# Patient Record
Sex: Male | Born: 1990 | Race: Black or African American | Hispanic: No | Marital: Single | Smoking: Never smoker
Health system: Southern US, Community
[De-identification: ages and names within clinical notes are randomized; demographics above are authoritative.]

---

## 2014-07-14 ENCOUNTER — Emergency Department (HOSPITAL_COMMUNITY): Payer: BLUE CROSS/BLUE SHIELD

## 2014-07-14 ENCOUNTER — Encounter (HOSPITAL_COMMUNITY): Payer: Self-pay | Admitting: Emergency Medicine

## 2014-07-14 ENCOUNTER — Emergency Department (HOSPITAL_COMMUNITY)
Admission: EM | Admit: 2014-07-14 | Discharge: 2014-07-14 | Disposition: A | Discharge status: Home / Self Care | Payer: BLUE CROSS/BLUE SHIELD | Attending: Emergency Medicine | Admitting: Emergency Medicine

## 2014-07-14 DIAGNOSIS — Y998 Other external cause status: Secondary | ICD-10-CM | POA: Diagnosis not present

## 2014-07-14 DIAGNOSIS — Y929 Unspecified place or not applicable: Secondary | ICD-10-CM | POA: Insufficient documentation

## 2014-07-14 DIAGNOSIS — S93402A Sprain of unspecified ligament of left ankle, initial encounter: Secondary | ICD-10-CM | POA: Insufficient documentation

## 2014-07-14 DIAGNOSIS — X58XXXA Exposure to other specified factors, initial encounter: Secondary | ICD-10-CM | POA: Diagnosis not present

## 2014-07-14 DIAGNOSIS — Y9389 Activity, other specified: Secondary | ICD-10-CM | POA: Diagnosis not present

## 2014-07-14 DIAGNOSIS — S99912A Unspecified injury of left ankle, initial encounter: Secondary | ICD-10-CM | POA: Diagnosis present

## 2014-07-14 MED ORDER — IBUPROFEN 800 MG PO TABS: 800.0000 mg | ORAL_TABLET | Freq: Three times a day (TID) | ORAL | Status: AC

## 2014-07-14 NOTE — Discharge Instructions (Signed)
Be sure to read and understand instructions below prior to leaving the hospital. If your symptoms persist without any improvement in 1 week it is recommended that you follow up with orthopedics listed above. .  Ankle Sprain  An ankle sprain is an injury to the ligaments that hold the ankle joint together. Your X-ray today showed no evidence of fracture, however keep all follow-up appointments with an orthopedic specialist to have follow-up X-rays, because as we discussed fractures may not appear until 3 days after the acute injury.    TREATMENT  Rest, ice, elevation, and compression are the basic modes of treatment.    HOME CARE INSTRUCTIONS  Apply ice to the sore area for 15 to 20 minutes, 3 to 4 times per day. Do this while you are awake for the first 2 days, or as directed. This can be stopped when the swelling goes away. Put the ice in a plastic bag and place a towel between the bag of ice and your skin.  Keep your leg elevated when possible to lessen swelling.  If your caregiver recommends crutches, use them as instructed for 1 week. Then, you may walk on your ankle weight bearing as tolerated.  You may take off your ankle stabilizer at night and to take a shower or bath. Wiggle your toes in the splint several times per day if you are able.  Do not drive a vehicle on pain medication. ACTIVITY:            - Weight bearing as tolerated            - Exercises should be limited to pain free range of motion            - Can start mobilization by tracing the alphabet with your foot in the air.       SEEK MEDICAL CARE IF:  You have an increase in bruising, swelling, or pain.  Your toes feel cold.  Pain relief is not achieved with medications.  EMERGENCY:: Your toes are numb or blue or you have severe pain.  MAKE SURE YOU:  Understand these instructions.  Will watch your condition.  Will get help right away if you are not doing well or get worse   COLD THERAPY DIRECTIONS:  Ice or gel  packs can be used to reduce both pain and swelling. Ice is the most helpful within the first 24 to 48 hours after an injury or flareup from overusing a muscle or joint.  Ice is effective, has very few side effects, and is safe for most people to use.   If you expose your skin to cold temperatures for too long or without the proper protection, you can damage your skin or nerves. Watch for signs of skin damage due to cold.   HOME CARE INSTRUCTIONS  Follow these tips to use ice and cold packs safely.  Place a dry or damp towel between the ice and skin. A damp towel will cool the skin more quickly, so you may need to shorten the time that the ice is used.  For a more rapid response, add gentle compression to the ice.  Ice for no more than 10 to 20 minutes at a time. The bonier the area you are icing, the less time it will take to get the benefits of ice.  Check your skin after 5 minutes to make sure there are no signs of a poor response to cold or skin damage.  Rest 20 minutes or  more in between uses.  Once your skin is numb, you can end your treatment. You can test numbness by very lightly touching your skin. The touch should be so light that you do not see the skin dimple from the pressure of your fingertip. When using ice, most people will feel these normal sensations in this order: cold, burning, aching, and numbness.  Do not use ice on someone who cannot communicate their responses to pain, such as small children or people with dementia.   HOW TO MAKE AN ICE PACK  To make an ice pack, do one of the following:  Place crushed ice or a bag of frozen vegetables in a sealable plastic bag. Squeeze out the excess air. Place this bag inside another plastic bag. Slide the bag into a pillowcase or place a damp towel between your skin and the bag.  Mix 3 parts water with 1 part rubbing alcohol. Freeze the mixture in a sealable plastic bag. When you remove the mixture from the freezer, it will be slushy.  Squeeze out the excess air. Place this bag inside another plastic bag. Slide the bag into a pillowcase or place a damp towel between your sk

## 2014-07-14 NOTE — ED Notes (Signed)
Ice pak applied 

## 2014-07-14 NOTE — ED Provider Notes (Signed)
CSN: [id number]     Arrival date & time 07/14/14  1600 History  This chart was scribed for Santiago Glad, PA-C, working with Elwin Mocha, MD by Chestine Spore, ED Scribe. The patient was seen in room TR07C/TR07C at 4:54 PM.    Chief Complaint  Patient presents with  . Ankle Pain      The history is provided by the patient. No language interpreter was used.    HPI Comments: Bobby Blankenship is a 24 y.o. male with no significant medical hx who presents to the Emergency Department complaining of left ankle pain onset 1 hour ago PTA. Pt twisted his ankle while moving furniture down a stairwell. Pt stepped down on his toes and his ankle rolled. Pt did not fall. Pt was able to walk initially after applying ice.  However, pt is having difficulty bearing weight at this time. He states that he is having associated symptoms of joint swelling. He states that he has tried four 200 mg Advil with moderate relief for his symptoms. He denies numbness, tingling, and any other symptoms.   History reviewed. No pertinent past medical history. History reviewed. No pertinent past surgical history. No family history on file. History  Substance Use Topics  . Smoking status: Never Smoker   . Smokeless tobacco: Not on file  . Alcohol Use: No    Review of Systems  Musculoskeletal: Positive for myalgias, joint swelling and arthralgias.  Neurological: Negative for numbness.       No tingling      Allergies  Review of patient's allergies indicates no known allergies.  Home Medications   Prior to Admission medications   Not on File   BP 140/87 mmHg  Pulse 87  Temp(Src) 98 F (36.7 C) (Oral)  Resp 16  Ht  (1.753 m)  Wt 250 lb (113.399 kg)  BMI 36.90 kg/m2  SpO2 95%  Physical Exam  Constitutional: He is oriented to person, place, and time. He appears well-developed and well-nourished. No distress.  HENT:  Head: Normocephalic and atraumatic.  Eyes: EOM are normal.  Neck: Neck supple. No  tracheal deviation present.  Cardiovascular: Normal rate, regular rhythm and normal heart sounds.   Pulses:      Dorsalis pedis pulses are 2+ on the left side.  Distal sensation intact.   Pulmonary/Chest: Effort normal and breath sounds normal. No respiratory distress.  Musculoskeletal: Normal range of motion.       Left knee: He exhibits normal range of motion and no swelling. No tenderness found.       Left ankle: He exhibits swelling. Tenderness. Lateral malleolus tenderness found. No medial malleolus tenderness found.  Tenderness and swelling on the left lateral malleolus. No tenderness or swelling or the medial malleolus.  Pt can wiggle his toes. ROM of left ankle is limited secondary to pain.   Neurological: He is alert and oriented to person, place, and time.  Skin: Skin is warm and dry.  Psychiatric: He has a normal mood and affect. His behavior is normal.  Nursing note and vitals reviewed.   ED Course  Procedures (including critical care time) DIAGNOSTIC STUDIES: Oxygen Saturation is 95% on RA, normal by my interpretation.    COORDINATION OF CARE: 4:57 PM-Discussed treatment plan which includes left ankle x-ray with pt at bedside and pt agreed to plan.   Labs Review Labs Reviewed - No data to display  Imaging Review Dg Ankle Complete Left  07/14/2014   CLINICAL DATA:  Twisted left ankle  down stairs today, lateral malleolus pain  EXAM: LEFT ANKLE COMPLETE - 3+ VIEW  COMPARISON:  None.  FINDINGS: Three views of left ankle submitted. No acute fracture or subluxation. Ankle mortise is preserved. There is soft tissue swelling adjacent to lateral malleolus.  IMPRESSION: No acute fracture or subluxation.  Lateral soft tissue swelling.   Electronically Signed   By: Natasha MeadLiviu  Pop M.D.   On: 07/14/2014 16:56     EKG Interpretation None      MDM   Final diagnoses:  None   Patient presents today after an ankle injury.  Xray is negative for fracture.  Neurovascularly intact.   Patient given ASO and crutches.  Stable for discharge.  Return precautions given.    I personally performed the services described in this documentation, which was scribed in my presence. The recorded information has been reviewed and is accurate.   Santiago GladHeather Zacharia Sowles, PA-C 07/14/14 1718  Santiago GladHeather Imani Sherrin, PA-C 07/14/14 [id number]  Elwin MochaBlair Walden, MD 07/14/14 [phone]

## 2014-07-14 NOTE — ED Notes (Signed)
Pt. Demonstrated proper technique of using crutches.

## 2014-07-14 NOTE — ED Notes (Signed)
Pt reports 1 hour ago he twisted L ankle. Swelling noted and pt having difficulty baring weight.

## 2015-09-08 IMAGING — CR DG ANKLE COMPLETE 3+V*L*
3 series · 3 of 3 positions shown · non-contrast
Comparison: None.

CLINICAL DATA: Twisted left ankle down stairs today, lateral
malleolus pain

EXAM:
LEFT ANKLE COMPLETE - 3+ VIEW

[x ankle ap left]
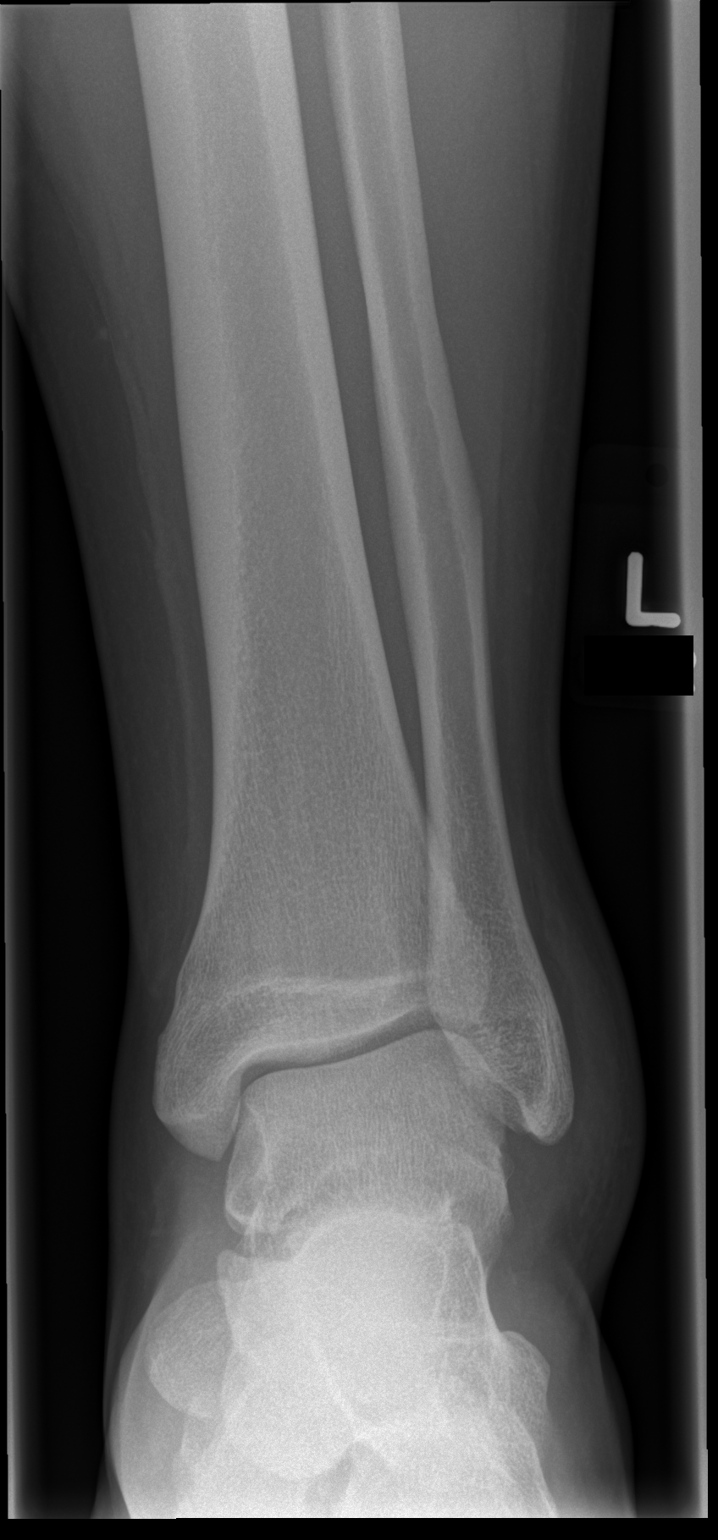

[x ankle obl left]
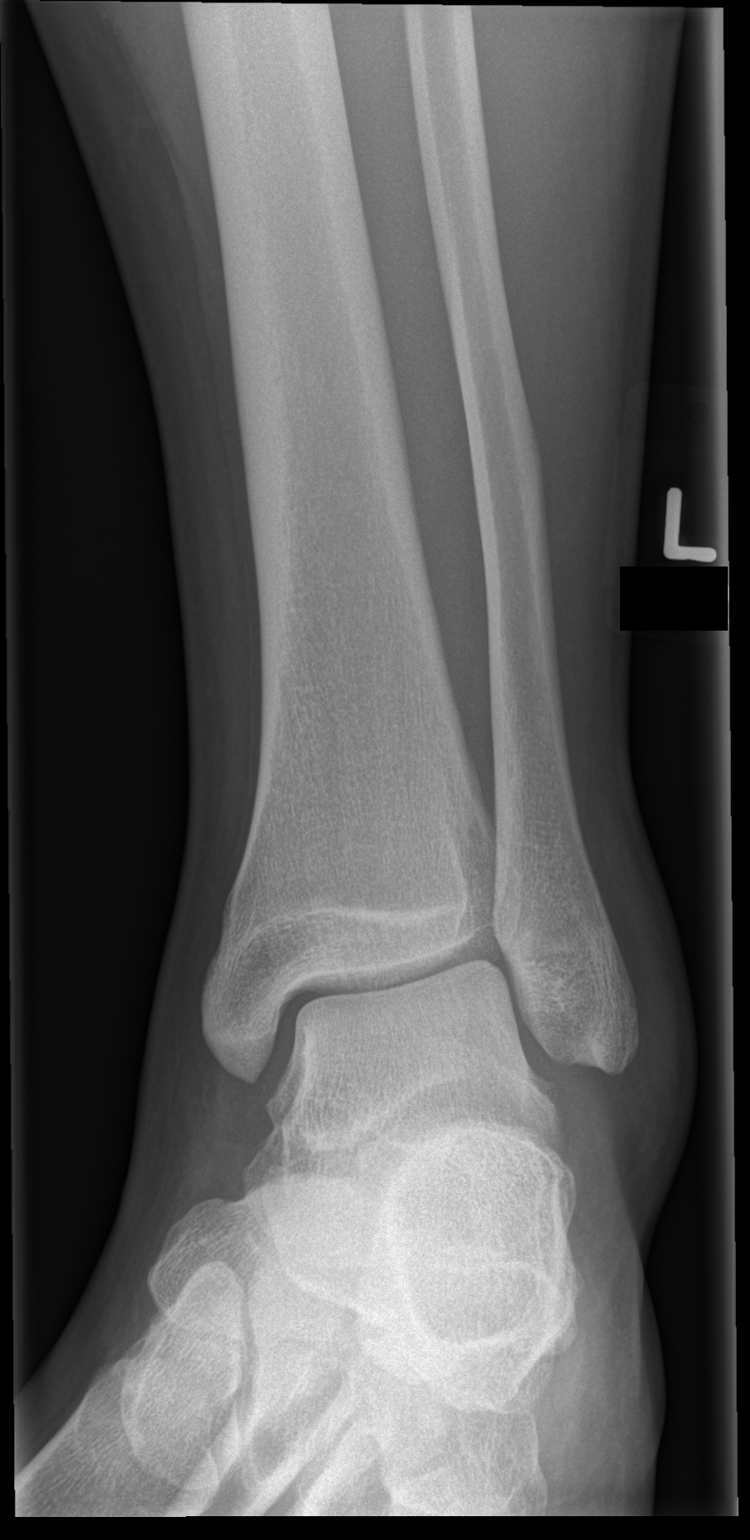

[x ankle lat left]
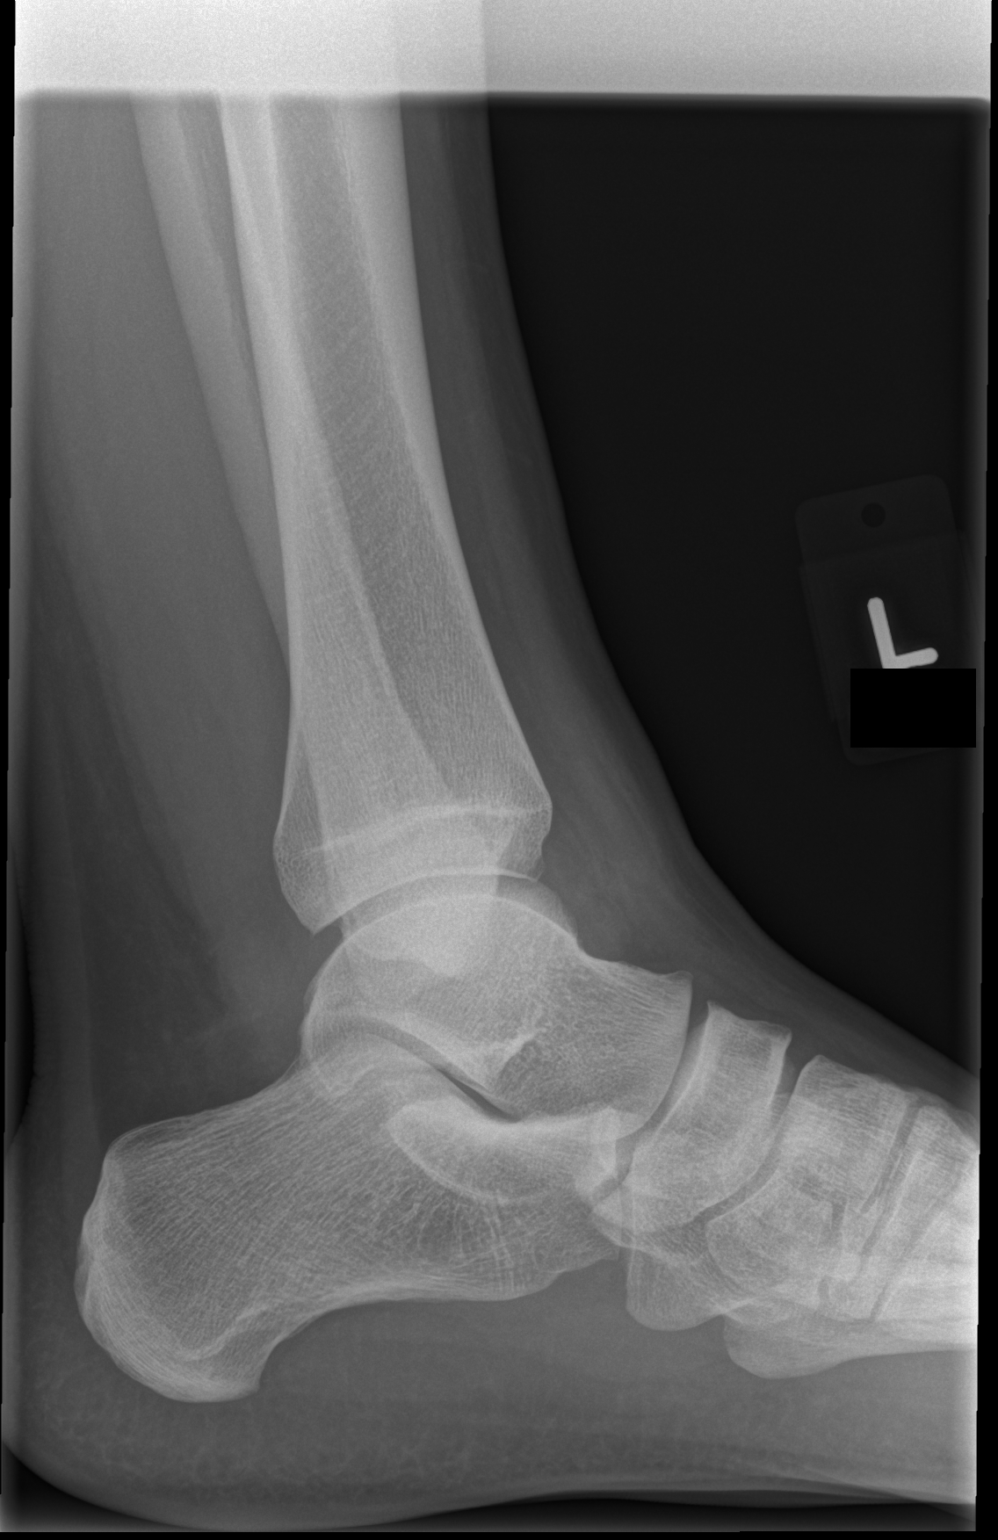

[3 of 3 positions shown; findings below may reference images not displayed]

FINDINGS: Three views of left ankle submitted. No acute fracture or
subluxation. Ankle mortise is preserved. There is soft tissue
swelling adjacent to lateral malleolus.
IMPRESSION: No acute fracture or subluxation.  Lateral soft tissue swelling.

## 2015-12-20 ENCOUNTER — Emergency Department (HOSPITAL_COMMUNITY)
Admission: EM | Admit: 2015-12-20 | Discharge: 2015-12-20 | Disposition: A | Discharge status: Home / Self Care | Payer: BLUE CROSS/BLUE SHIELD | Attending: Emergency Medicine | Admitting: Emergency Medicine

## 2015-12-20 ENCOUNTER — Encounter (HOSPITAL_COMMUNITY): Payer: Self-pay | Admitting: Emergency Medicine

## 2015-12-20 DIAGNOSIS — R109 Unspecified abdominal pain: Secondary | ICD-10-CM

## 2015-12-20 LAB — COMPREHENSIVE METABOLIC PANEL
ALT: 18 U/L (ref 17–63)
AST: 25 U/L (ref 15–41)
Albumin: 4.1 g/dL (ref 3.5–5.0)
Alkaline Phosphatase: 51 U/L (ref 38–126)
Anion gap: 10 (ref 5–15)
BILIRUBIN TOTAL: 0.8 mg/dL (ref 0.3–1.2)
BUN: 12 mg/dL (ref 6–20)
CO2: 24 mmol/L (ref 22–32)
Calcium: 9.6 mg/dL (ref 8.9–10.3)
Chloride: 104 mmol/L (ref 101–111)
Creatinine, Ser: 1.26 mg/dL — ABNORMAL HIGH (ref 0.61–1.24)
GFR calc Af Amer: >60 mL/min (ref >60)
Glucose, Bld: 162 mg/dL — ABNORMAL HIGH (ref 65–99)
Potassium: 3.6 mmol/L (ref 3.5–5.1)
Sodium: 138 mmol/L (ref 135–145)
TOTAL PROTEIN: 7.3 g/dL (ref 6.5–8.1)

## 2015-12-20 LAB — CBC
HCT: 44.2 % (ref 39.0–52.0)
Hemoglobin: 14.3 g/dL (ref 13.0–17.0)
MCH: 27.7 pg (ref 26.0–34.0)
MCHC: 32.4 g/dL (ref 30.0–36.0)
MCV: 85.7 fL (ref 78.0–100.0)
PLATELETS: 251 10*3/uL (ref 150–400)
RBC: 5.16 MIL/uL (ref 4.22–5.81)
RDW: 13.7 % (ref 11.5–15.5)
WBC: 6.6 10*3/uL (ref 4.0–10.5)

## 2015-12-20 LAB — URINALYSIS, ROUTINE W REFLEX MICROSCOPIC
Bilirubin Urine: NEGATIVE
Glucose, UA: 500 mg/dL — AB
Hgb urine dipstick: NEGATIVE
Ketones, ur: NEGATIVE mg/dL
LEUKOCYTES UA: NEGATIVE
Nitrite: NEGATIVE
PROTEIN: NEGATIVE mg/dL
Specific Gravity, Urine: 1.026 (ref 1.005–1.030)
pH: 6 (ref 5.0–8.0)

## 2015-12-20 LAB — LIPASE, BLOOD: Lipase: 21 U/L (ref 11–51)

## 2015-12-20 NOTE — ED Triage Notes (Signed)
Pt c/o pain and discomfort in abdomen around belly button. Pain since Wednesday. Tender. Nausea earlier but no vomiting. Denies diarrhea.

## 2015-12-20 NOTE — ED Provider Notes (Signed)
[facility]-EMERGENCY DEPT Provider Note   CSN: [id number] Arrival date & time: 12/20/15  1537     History   Chief Complaint Chief Complaint  Patient presents with  . Abdominal Pain    HPI Bobby Blankenship is a 25 y.o. male.  HPI   25 year old male presents today with complaints of abdominal pain. Patient reports that 3 days ago Bobby Blankenship expressed a sharp pain superior to his umbilicus. Patient notes symptoms were worsened by sitting up. Patient notes that Bobby Blankenship has been feeling the pain in his abdomen in different areas, but had point pain over that region. Patient reports Bobby Blankenship works at [employer], was concern for a hernia. At the time of evaluation patient reports symptoms are completely resolved. Patient denies any nausea or vomiting, fever or chills, denies any change in urine or bowel characteristics or frequency. Patient denies any palpable masses in his abdomen or groin.  History reviewed. No pertinent past medical history.  There are no active problems to display for this patient.   History reviewed. No pertinent surgical history.    Home Medications    Prior to Admission medications   Medication Sig Start Date End Date Taking? Authorizing Provider  ibuprofen (ADVIL,MOTRIN) 800 MG tablet Take 1 tablet (800 mg total) by mouth 3 (three) times daily. 07/14/14   Santiago GladHeather Laisure, PA-C    Family History No family history on file.  Social History Social History  Substance Use Topics  . Smoking status: Never Smoker  . Smokeless tobacco: Not on file  . Alcohol use No     Allergies   Review of patient's allergies indicates no known allergies.   Review of Systems Review of Systems  All other systems reviewed and are negative.    Physical Exam Updated Vital Signs BP 128/67 (BP Location: Right Arm)   Pulse 60   Temp 98.3 F (36.8 C) (Oral)   Resp 16   SpO2 97%   Physical Exam  Constitutional: Bobby Blankenship is oriented to person, place, and time. Bobby Blankenship appears well-developed and well-nourished.    HENT:  Head: Normocephalic and atraumatic.  Eyes: Conjunctivae are normal. Pupils are equal, round, and reactive to light. Right eye exhibits no discharge. Left eye exhibits no discharge. No scleral icterus.  Neck: Normal range of motion. No JVD present. No tracheal deviation present.  Pulmonary/Chest: Effort normal. No stridor.  Abdominal:  Abdomen nontender, unable to appreciate defect in abdominal wall, no obvious hernia  Neurological: Bobby Blankenship is alert and oriented to person, place, and time. Coordination normal.  Psychiatric: Bobby Blankenship has a normal mood and affect. His behavior is normal. Judgment and thought content normal.  Nursing note and vitals reviewed.    ED Treatments / Results  Labs (all labs ordered are listed, but only abnormal results are displayed) Labs Reviewed  COMPREHENSIVE METABOLIC PANEL - Abnormal; Notable for the following:       Result Value   Glucose, Bld 162 (*)    Creatinine, Ser 1.26 (*)    All other components within normal limits  LIPASE, BLOOD  CBC  URINALYSIS, ROUTINE W REFLEX MICROSCOPIC (NOT AT [facility])    EKG  EKG Interpretation None       Radiology No results found.  Procedures Procedures (including critical care time)  Medications Ordered in ED Medications - No data to display   Initial Impression / Assessment and Plan / ED Course  I have reviewed the triage vital signs and the nursing notes.  Pertinent labs & imaging results that were available  during my care of the patient were reviewed by me and considered in my medical decision making (see chart for details).  Clinical Course     Final Clinical Impressions(s) / ED Diagnoses   Final diagnoses:  Abdominal pain, unspecified abdominal location    Labs:Lipase, CMP, CBC  Imaging:  Consults:  Therapeutics:  Discharge Meds:   Assessment/Plan:  25 year old male presents today with complaints of abdominal pain. I suspect this could be a small hernia, cannot feel any palpable  mass, low suspicion for incarceration, patient asymptomatic at time of evaluation. Patient will need surgery follow-up. Patient given strict return precautions, verbalized understanding and agreement to today's plan had no further questions concerns at time of discharge.   New Prescriptions New Prescriptions   No medications on file     Eyvonne Mechanic, PA-C 12/20/15 1828    Lyndal Pulley, MD 12/21/15 [phone]

## 2015-12-20 NOTE — Discharge Instructions (Signed)
Please follow-up with General surgery for further evaluation and management. Please return to the emergency room immediately if you experience any new or worsening signs or symptoms

## 2015-12-20 NOTE — ED Notes (Signed)
Pt d/c home, ambulatory.
